# Patient Record
Sex: Female | Born: 1935 | Race: White | Hispanic: No | Marital: Married
Health system: Southern US, Community
[De-identification: ages and names within clinical notes are randomized; demographics above are authoritative.]

---

## 2003-05-04 ENCOUNTER — Ambulatory Visit (HOSPITAL_COMMUNITY): Admission: RE | Admit: 2003-05-04 | Discharge: 2003-05-04 | Payer: Self-pay | Admitting: Urology

## 2008-06-28 ENCOUNTER — Ambulatory Visit (HOSPITAL_COMMUNITY): Admission: RE | Admit: 2008-06-28 | Discharge: 2008-06-28 | Payer: Self-pay | Admitting: Pulmonary Disease

## 2008-08-03 ENCOUNTER — Encounter (HOSPITAL_COMMUNITY): Admission: RE | Admit: 2008-08-03 | Discharge: 2008-08-31 | Payer: Self-pay | Admitting: Pulmonary Disease

## 2008-08-04 ENCOUNTER — Ambulatory Visit (HOSPITAL_COMMUNITY): Payer: Self-pay | Admitting: Pulmonary Disease

## 2008-08-04 ENCOUNTER — Other Ambulatory Visit: Payer: Self-pay | Admitting: Pulmonary Disease

## 2008-09-07 ENCOUNTER — Ambulatory Visit: Payer: Self-pay | Admitting: Internal Medicine

## 2008-09-08 ENCOUNTER — Encounter: Payer: Self-pay | Admitting: Internal Medicine

## 2008-09-08 LAB — CONVERTED CEMR LAB
Eosinophils Relative: 2 % (ref 0–5)
Ferritin: 6 ng/mL — ABNORMAL LOW (ref 10–291)
HCT: 34 % — ABNORMAL LOW (ref 36.0–46.0)
Hemoglobin: 9.5 g/dL — ABNORMAL LOW (ref 12.0–15.0)
Iron: 14 ug/dL — ABNORMAL LOW (ref 42–145)
Lymphocytes Relative: 19 % (ref 12–46)
Lymphs Abs: 1 10*3/uL (ref 0.7–4.0)
Monocytes Absolute: 0.4 10*3/uL (ref 0.1–1.0)
Neutrophils Relative %: 71 % (ref 43–77)
Platelets: 268 10*3/uL (ref 150–400)
RDW: 25.7 % — ABNORMAL HIGH (ref 11.5–15.5)
UIBC: 412 ug/dL

## 2008-09-23 ENCOUNTER — Encounter: Payer: Self-pay | Admitting: Internal Medicine

## 2008-09-23 ENCOUNTER — Ambulatory Visit: Payer: Self-pay | Admitting: Internal Medicine

## 2008-09-23 ENCOUNTER — Ambulatory Visit (HOSPITAL_COMMUNITY): Admission: RE | Admit: 2008-09-23 | Discharge: 2008-09-23 | Payer: Self-pay | Admitting: Internal Medicine

## 2008-09-30 ENCOUNTER — Ambulatory Visit: Payer: Self-pay | Admitting: Internal Medicine

## 2008-10-14 ENCOUNTER — Ambulatory Visit: Payer: Self-pay | Admitting: Internal Medicine

## 2008-11-28 ENCOUNTER — Encounter (INDEPENDENT_AMBULATORY_CARE_PROVIDER_SITE_OTHER): Payer: Self-pay

## 2008-12-01 ENCOUNTER — Encounter: Payer: Self-pay | Admitting: Internal Medicine

## 2008-12-19 LAB — CONVERTED CEMR LAB
Eosinophils Absolute: 0.2 10*3/uL (ref 0.0–0.7)
Eosinophils Relative: 4 % (ref 0–5)
Hemoglobin: 10.6 g/dL — ABNORMAL LOW (ref 12.0–15.0)
Lymphs Abs: 1.4 10*3/uL (ref 0.7–4.0)
Monocytes Relative: 10 % (ref 3–12)
Neutrophils Relative %: 60 % (ref 43–77)
RBC: 5.17 M/uL — ABNORMAL HIGH (ref 3.87–5.11)
RDW: 18.4 % — ABNORMAL HIGH (ref 11.5–15.5)
WBC: 5.5 10*3/uL (ref 4.0–10.5)

## 2008-12-29 ENCOUNTER — Ambulatory Visit: Payer: Self-pay | Admitting: Internal Medicine

## 2008-12-30 ENCOUNTER — Encounter: Payer: Self-pay | Admitting: Internal Medicine

## 2009-01-16 ENCOUNTER — Encounter (INDEPENDENT_AMBULATORY_CARE_PROVIDER_SITE_OTHER): Payer: Self-pay

## 2009-02-02 ENCOUNTER — Encounter: Payer: Self-pay | Admitting: Internal Medicine

## 2009-02-07 DIAGNOSIS — R5381 Other malaise: Secondary | ICD-10-CM

## 2009-02-07 DIAGNOSIS — R609 Edema, unspecified: Secondary | ICD-10-CM

## 2009-02-07 DIAGNOSIS — R05 Cough: Secondary | ICD-10-CM

## 2009-02-07 DIAGNOSIS — R5383 Other fatigue: Secondary | ICD-10-CM

## 2009-02-07 DIAGNOSIS — J40 Bronchitis, not specified as acute or chronic: Secondary | ICD-10-CM | POA: Insufficient documentation

## 2009-02-07 DIAGNOSIS — R059 Cough, unspecified: Secondary | ICD-10-CM | POA: Insufficient documentation

## 2009-02-07 DIAGNOSIS — H409 Unspecified glaucoma: Secondary | ICD-10-CM | POA: Insufficient documentation

## 2009-02-07 DIAGNOSIS — J329 Chronic sinusitis, unspecified: Secondary | ICD-10-CM | POA: Insufficient documentation

## 2009-02-07 DIAGNOSIS — R0602 Shortness of breath: Secondary | ICD-10-CM

## 2009-02-09 ENCOUNTER — Ambulatory Visit: Payer: Self-pay | Admitting: Internal Medicine

## 2009-02-09 DIAGNOSIS — D508 Other iron deficiency anemias: Secondary | ICD-10-CM | POA: Insufficient documentation

## 2009-02-10 ENCOUNTER — Encounter: Payer: Self-pay | Admitting: Gastroenterology

## 2009-03-27 ENCOUNTER — Encounter (INDEPENDENT_AMBULATORY_CARE_PROVIDER_SITE_OTHER): Payer: Self-pay

## 2009-03-31 ENCOUNTER — Encounter: Payer: Self-pay | Admitting: Gastroenterology

## 2009-04-04 ENCOUNTER — Encounter: Payer: Self-pay | Admitting: Gastroenterology

## 2009-04-04 LAB — CONVERTED CEMR LAB
Basophils Relative: 1 % (ref 0–1)
Eosinophils Relative: 2 % (ref 0–5)
HCT: 40.1 % (ref 36.0–46.0)
Hemoglobin: 11.9 g/dL — ABNORMAL LOW (ref 12.0–15.0)
MCHC: 29.7 g/dL — ABNORMAL LOW (ref 30.0–36.0)
MCV: 77.1 fL — ABNORMAL LOW (ref 78.0–100.0)
Monocytes Absolute: 0.5 10*3/uL (ref 0.1–1.0)
Neutro Abs: 4 10*3/uL (ref 1.7–7.7)
RDW: 18.6 % — ABNORMAL HIGH (ref 11.5–15.5)

## 2009-06-20 ENCOUNTER — Encounter (INDEPENDENT_AMBULATORY_CARE_PROVIDER_SITE_OTHER): Payer: Self-pay

## 2009-06-30 LAB — CONVERTED CEMR LAB
Basophils Relative: 0 % (ref 0–1)
Eosinophils Absolute: 0.2 10*3/uL (ref 0.0–0.7)
Eosinophils Relative: 4 % (ref 0–5)
Lymphocytes Relative: 29 % (ref 12–46)
Lymphs Abs: 1.5 10*3/uL (ref 0.7–4.0)
Monocytes Absolute: 0.5 10*3/uL (ref 0.1–1.0)
Monocytes Relative: 10 % (ref 3–12)
Neutrophils Relative %: 57 % (ref 43–77)
RDW: 17.2 % — ABNORMAL HIGH (ref 11.5–15.5)
WBC: 5.1 10*3/uL (ref 4.0–10.5)

## 2010-04-17 IMAGING — CR DG CHEST 2V
2 series · 2 of 2 positions shown · non-contrast
Comparison: None

CLINICAL DATA: Shortness of breath

CHEST - 2 VIEW

[view not recorded (1 of 2)]
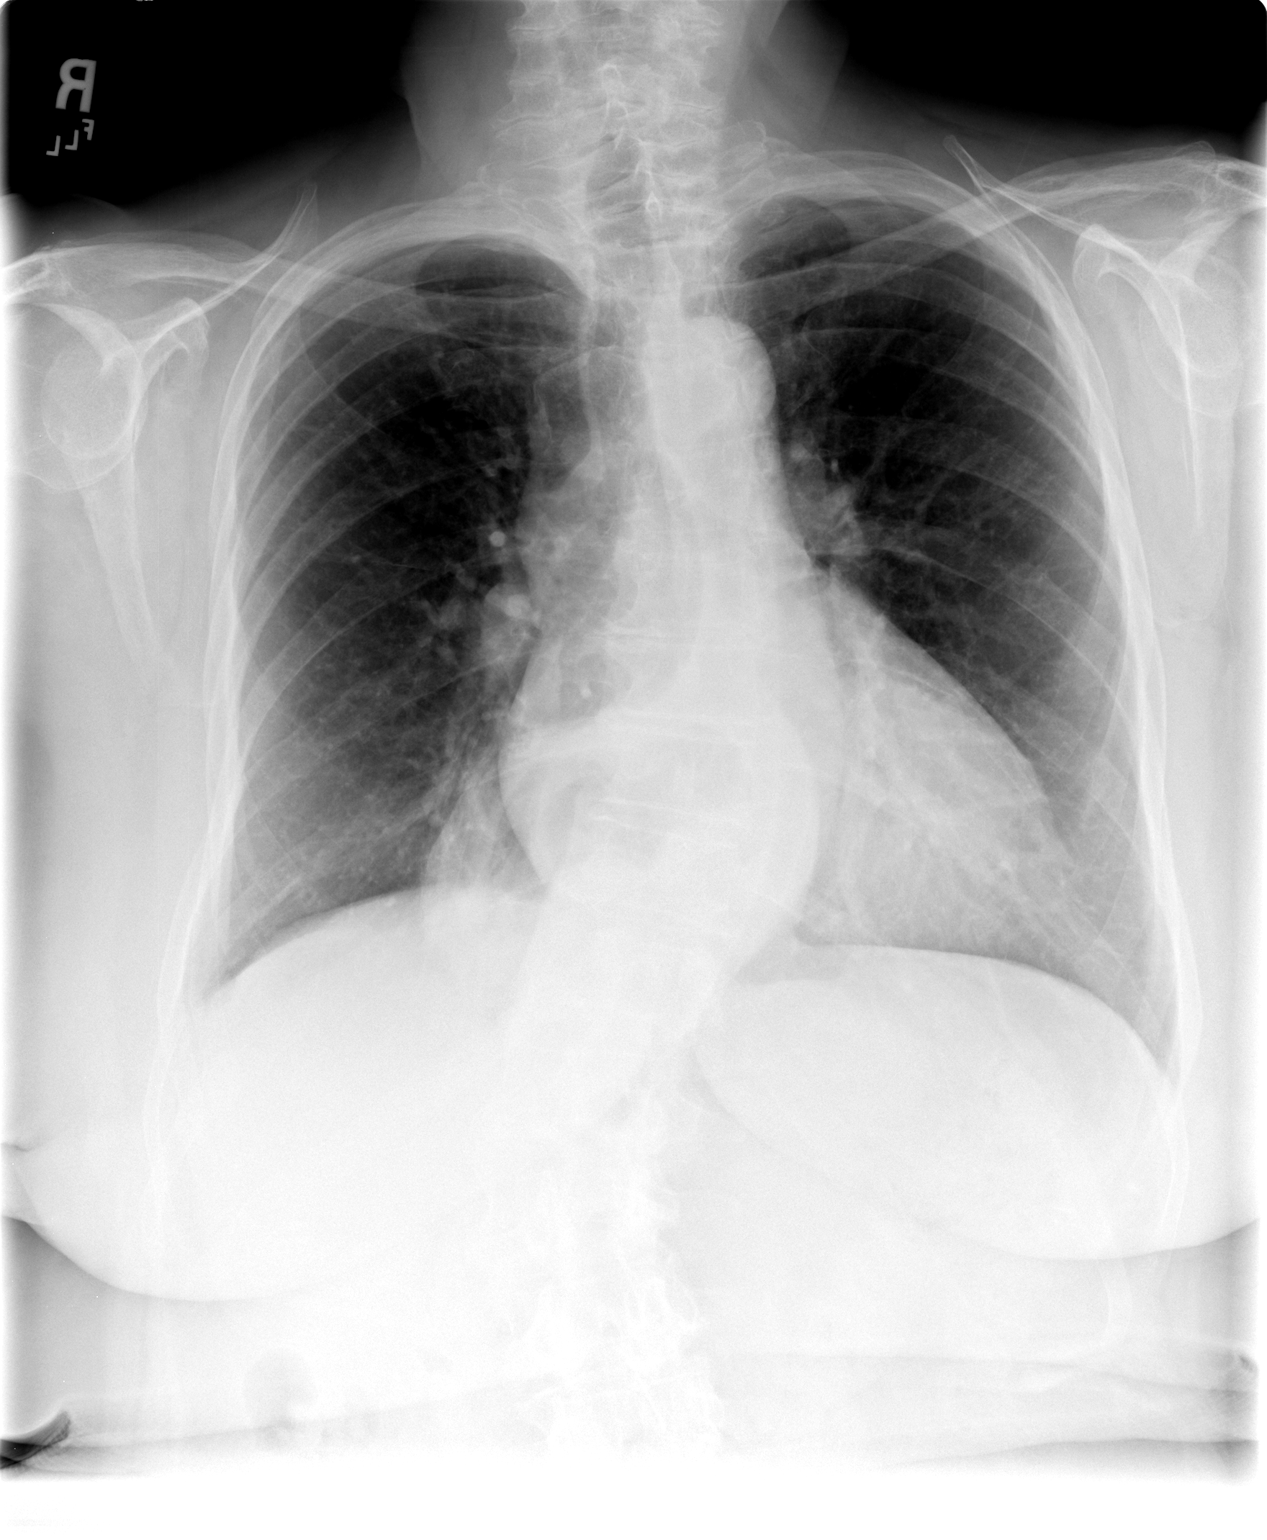

[view not recorded (2 of 2)]
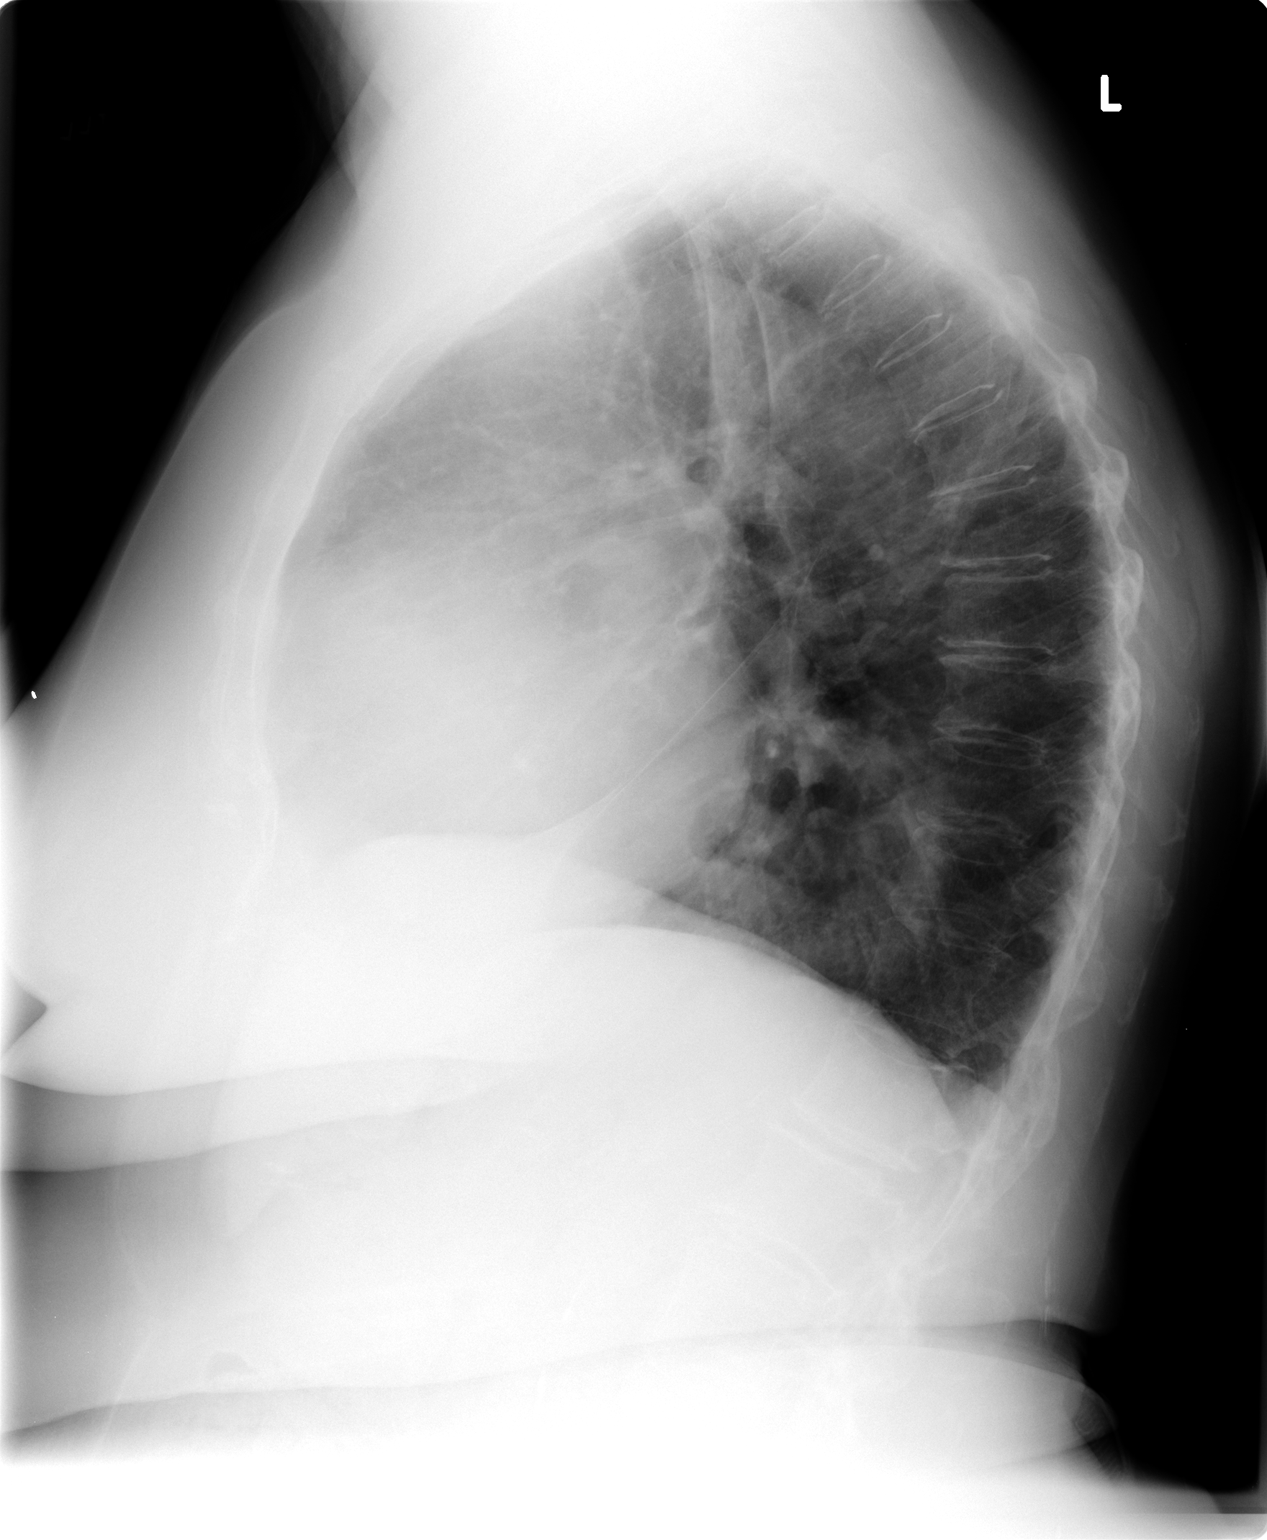

[2 of 2 positions shown; findings below may reference images not displayed]

FINDINGS: Mild cardiac enlargement.
Tortuous aorta.
Moderate sized hiatal hernia.
Mediastinal contours and pulmonary vascularity otherwise normal.
Mild bronchitic changes without infiltrate or effusion.
Biconvex thoracolumbar scoliosis and bony demineralization.
IMPRESSION: Cardiomegaly.
Hiatal hernia.
Mild bronchitic changes.

## 2011-01-15 NOTE — Op Note (Signed)
[name], Hancock                ACCOUNT NO.:  [id number]   MEDICAL RECORD NO.:  [id number]          PATIENT TYPE:  AMB   LOCATION:  DAY                           FACILITY:  [facility]   PHYSICIAN:  R. Roetta Sessions, M.D. DATE OF BIRTH:  [birth year]   DATE OF PROCEDURE:  09/23/2008  DATE OF DISCHARGE:                               OPERATIVE REPORT   PROCEDURE:  Ileocolonoscopy followed by esophagogastroduodenoscopy with  small bowel biopsy.   INDICATIONS FOR PROCEDURE:  A 77 year old lady sent over to me at the  courtesy of Dr. Kari Baars to further evaluate a profound microcytic  anemia, requiring transfusion recently.  She is essentially devoid of  any GI tract symptoms as Hemoccult negative at least 1 occasion through  our office on September 09, 2008.  She was found to have the following  laboratory abnormalities.  H&H 9.5 and 34.0, MCV 66.9, serum iron level  came back low at 14, and TIBC was 426.  Ferritin was 6 and iron  saturation 3%.  Colonoscopy is now being performed and the colonoscopy  is unrevealing.  As for the cause of bleeding, she will undergo an EGD.  Risks, benefits, alternatives, and limitations of this approach have  been reviewed, previously indicated in the bedside,  all questions were  answered and all parties agreeable.   It is notable that Carla Hancock has been taking Aleve recently for  arthritis.   PROCEDURE NOTE:  O2 saturation, blood pressure, pulse, and respirations  were monitored throughout the entirety of both procedures.   CONSCIOUS SEDATION:  Versed 75 mg IV and Demerol 6 mg IV in divided  doses.   INSTRUMENTATION:  Pentax video chip system.   FINDINGS:  Colonoscopy:  Digital rectal exam revealed no abnormalities.  Endoscopic findings:  The prep was good.  Colon:  Colonic mucosa was  surveyed from the rectosigmoid junction through the left transverse,  right colon, to the appendiceal orifice, ileocecal valve, and cecum.  These structures were well  seen and photographed for the record.  Terminal ileum was intubated to 5 cm.  From this level, the scope was  slowly and cautiously withdrawn.  All previously mentioned mucosal  surfaces were again seen.  She had a relatively haustral colon.  Mucosal  surfaces were well seen.  The patient had a left-sided diverticula  scattered; however, the remainder of the colonic mucosa appeared normal.  There was a single 2-mm erosion seen in the terminal ileum.  The scope  was pulled down to the rectum with thorough examination of the rectal  mucosa including the retroflexed view of the anal verge demonstrated no  abnormalities.  The patient tolerated this procedure well and was  prepared for EGD.   FINDINGS:  Examination of the tubular esophagus revealed a noncritical  Schatzki ring; otherwise, esophageal mucosa appeared unremarkable.  EGD  junction easily traversed.  Stomach:  Gastric cavity was emptied and  insufflated well with air.  Thorough examination of the gastric mucosa  including the retroflexed view of the proximal stomach and  esophagogastric junction demonstrated a rather large hiatal hernia  and a  2-cm submucosal lesion, most consistent with a lipoma along the lesser  curvature.  Please see photos.  Otherwise, gastric mucosa appeared  entirely normal.  The pylorus was patent and easily traversed.  Examination of the bulb, second, and third portion revealed no  abnormalities.  Therapeutic/diagnostic maneuvers were performed.  Biopsies of D2 and D3 were taken for histologic study.  The patient  tolerated the procedure well and was reacted to Endoscopy.   IMPRESSION:  Colonoscopy:  Normal rectum, left-sided diverticula,  colonic mucosa appeared normal, single terminal ileal erosion.  Esophagogastroduodenoscopy findings, noncritical Schatzki ring not  manipulated; otherwise, normal esophagus rather large hiatal hernia,  submucosal lesion consistent with lipoma lesser curvature;  otherwise,  unremarkable gastric mucosa, patent pylorus, normal D1-D3 status post  biopsy of D3 and D3.   I suspect Carla Hancock either has malabsorption of iron going on or she has  a nonsteroidal antiinflammatory drug enteropathy and has been using  blood from small bowel insult related to Aleve.  I doubt celiac disease  but these biopsies will explore that possibility.   RECOMMENDATIONS:  1. Follow up on path.  2. I would like Carla Hancock to return 3 Hemoccults, not prior to next      week.  We will line this up through the office.  Follow up on path.      If her path is unrevealing and any of her Hemoccults are positive,      we would proceed with a video capsule study of her small bowel.  If      path is negative and Hemoccults were negative, we would simply ask      her to entirely refrain from taking any nonsteroidals and see if      her H&H would rebound without further intervention.      Carla Hancock, M.D.  Electronically Signed     RMR/MEDQ  D:  09/23/2008  T:  09/23/2008  Job:  [id number]   cc:   Ramon Dredge L. Juanetta Gosling, M.D.  Fax: [phone]   J. Darreld Mclean, M.D.  Fax: [phone]

## 2011-01-15 NOTE — H&P (Signed)
NAME:  Carla Hancock, Carla Hancock                ACCOUNT NO.:  [id number]   MEDICAL RECORD NO.:  [id number]          PATIENT TYPE:  AMB   LOCATION:  DAY                           FACILITY:  [facility]   PHYSICIAN:  R. Roetta Sessions, M.D. DATE OF BIRTH:  [birth year]   DATE OF ADMISSION:  DATE OF DISCHARGE:  LH                              HISTORY & PHYSICAL   REFERRING PHYSICIAN:  Edward L. Juanetta Gosling, MD   REASON FOR CONSULTATION:  Marked microcytic anemia.   Ms. Carla Hancock is a very pleasant 77 year old Caucasian female who was  sent over courtesy of Dr. Shaune Pollack to further evaluate her profound  microcytic anemia.  Apparently, she was visiting her daughter at  [place] and developed progressive dyspnea and fatigue, came back and  saw Dr. Juanetta Gosling and lab work revealed a hemoglobin in the 6 range.  She  received 2 units transfusion with packed red blood cells.  Her  hemoglobin came up in the 9.3 range, MCV markedly depressed in the 60  range.  By report, she is hemoccult negative, in fact Carla Hancock has not  had any gastrointestinal symptoms.  She denies rectal bleeding, melena,  constipation, or diarrhea.  She has not had any abdominal pain.  No  odynophagia, dysphagia, early satiety, reflux symptoms, nausea,  vomiting, or weight loss.  Appetite has been well maintained.  She has  never had her lower GI tract evaluated.  She tells me that many, many  years ago, she had a barium study which demonstrated a hiatal hernia.  She did have a recent chest x-ray, which demonstrated cardiomegaly and a  hiatal hernia as well.  She has never had her lower GI tract evaluated  in anyway.  There is no family history of GI malignancy, anemia, or  celiac disease.  It is notable that for a couple of months preceding her  being found to be anemic, she was taking a couple of Aleve daily for hip  pain under direction of Dr. Hilda Lias.   PAST MEDICAL HISTORY:  Significant for glaucoma and recent anemia.   PAST SURGERIES:   Complete hysterectomy, right foot surgery, left eye  surgery.  She was recently cast splinted for an avulsion fracture, right  ankle.   CURRENT MEDICATIONS:  Dorzolamide eye drops, Alphagan eye drops, Lumigan  eye drops, potassium 20 mEq daily.   ALLERGIES:  VIOXX causes hypertension, CELEBREX causes a rash.   FAMILY HISTORY:  Father died age 78 with MI.  Mother died age 67 with  some heart problems.  No history of chronic GI or liver illness.   SOCIAL HISTORY:  The patient is married.  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  No tobacco, no alcohol, no  illicit drugs.   REVIEW OF SYSTEMS:  As in the history of present illness.  Recent  progressive dyspnea until she was transfused.  No night sweats.  No  fever or chills.   PHYSICAL EXAMINATION:  GENERAL:  A  pleasant 77 year old lady resting  calmly.  VITAL SIGNS:  Weight 165, height 5 feet 3, temp 97.8, BP 180/82, pulse  84.  SKIN:  Warm and dry.  HEENT:  She does have a little pallor and there is no scleral icterus.  Conjunctivae are somewhat pale as well.  CHEST:  Lungs are clear to auscultation.  CARDIAC:  Regular rate and rhythm without murmur, gallop, or rub.  BREAST:  Deferred.  ABDOMEN:  Nondistended.  Positive bowel sounds, soft, entirely nontender  without appreciable mass or organomegaly.  EXTREMITIES:  No edema.  RECTAL:  Deferred, has colonoscopy nearing.   IMPRESSION:  Carla Hancock is a pleasant 77 year old lady who has  presented recently with a significant symptomatic microcytic anemia.  She is hemoccult negative, seen in Dr. Juanetta Gosling' office, and clinically  is essentially devoid of any GI tract symptoms by the fact she is 77 and  has never had a colonoscopy.   Nonsteroidal use preceded the onset of her symptoms leading to the  diagnosis of anemia.  In this setting, we certainly need to agree with  Dr.  Juanetta Gosling, we need to be concerned about intermittent blood loss from  her gastrointestinal tract anywhere along the line for that matter, iron  malabsorption, as well as bone marrow failure for that matter I suppose  will remain in the differential at this time.   RECOMMENDATIONS:  I told Carla Hancock she ought to go ahead and have a  diagnostic colonoscopy in the very near future.  Risks, benefits,  alternatives, and limitations have been reviewed.  I told her that if  her colonoscopy looked fine, then we would go ahead and just turn her  around and perform a diagnostic EGD at the same time.  This approach  also has been reviewed and she is agreeable.  Questions have been  answered.  I do not see where she has had iron studies drawn.  We will  go ahead and do iron studies today and we will repeat her CBC and see  where we stand.  I will make further recommendations in the very near  future.   I would like to thank Dr. Shaune Pollack for allowing me to see this nice  lady today in consultation.      Jonathon Bellows, M.D.  Electronically Signed     RMR/MEDQ  D:  09/07/2008  T:  09/08/2008  Job:  [id number]

## 2011-01-18 NOTE — Op Note (Signed)
Carla Hancock, Carla Hancock                          ACCOUNT NO.:  [id number]   MEDICAL RECORD NO.:  [id number]                   PATIENT TYPE:  AMB   LOCATION:  DAY                                  FACILITY:  [facility]   PHYSICIAN:  Cody M. Ulice Brilliant, D.P.M.               DATE OF BIRTH:  02-24-1931   DATE OF PROCEDURE:  DATE OF DISCHARGE:                                 OPERATIVE REPORT   PREOPERATIVE DIAGNOSES:  Hallux abductovalgus deformity right foot.   POSTOPERATIVE DIAGNOSES:  1. Hallux abductovalgus deformity right foot.  2. Hammer toe deformity second digit right foot.   PROCEDURE:  1. Keller bunionectomy first metatarsal phalangeal joint right foot with     0.062 K-wire fixation.  2. Arthroplasty second digit right foot with 0.045 K-wire fixation.   SURGEON:  Denny Peon. Ulice Brilliant, DPM   ANESTHESIA:  MAC   INDICATIONS FOR SURGERY:  This 77 year old white female with severe hallux  valgus deformity of right foot.  Great toe is noted to be abutting second  toe.  Second toe has some deviation to it, chronically painful.  All shoes  are uncomfortable.   DESCRIPTION OF PROCEDURE:  Carla Hancock is brought into the OR and placed on the  table in the supine position.  IV sedation is established.  A Mayo block is  performed about the first MTP of this right foot. A pneumatic ankle  tourniquet is then applied across her right ankle.  Her foot is prepped and  draped in the usual aseptic fashion.  An ACE bandage is utilized to  exsanguinate her foot.  The tourniquet is inflated to 250 mmHg.   Procedure #1: KELLER BUNIONECTOMY RIGHT FOOT.  Attention is directed to the  first MTP of the right foot.  A 7 cm curvilinear skin incision is made over  the dorsal central aspect of the first MTP extending to the midshaft of the  proximal phalanx and back to the midshaft of the first metatarsal.  The  incision is deepened via sharp and blunt and sharp dissection through  subcutaneous tissues.  Care is taken to  try to avoid neural and vascular  structures with any crossing vessels that cannot be retracted, clamped, and  cauterized.   With this performed, a dorsal linear capsulotomy is made from the midshaft  of the proximal phalanx to the distal two-thirds of the first metatarsal.  The capsular and periosteal tissues are reflected away from the medial  eminence of the first metatarsal head and about both the medial and lateral  aspects of the base of the proximal phalanx.  The tissue here is noted to be  relatively degenerative with gouty tophaceous deposits noted within.  The  capsular tissues are reflected away from the medial eminence.  The medial  eminence is then excised with #62 blade on the oscillating saw. What is very  noteworthy is that the first metatarsal  head is extremely soft.  Minimal  effort is needed to remove the medial eminence.   Attention is then directed to the base of the proximal phalanx.  The base of  the phalanx is then removed with the 62 blade with the oscillating saw and  the base of the proximal phalanx is then excised completely following  osteotomy with the 15 blade with sharp dissection.  The flexor tendon is  noted still to be intact. What is evident also, at this point is that the  first metatarsal is not moving in a more lateral or closing position. I felt  that following removal of the base of the proximal phalanx the IM angle  would likely close down, but it seems as though the first metatarsal  cuneiform articulation is not reducible.  A 0.062 K-wire is the introduced  and driven from the base of the proximal phalanx through the distal aspect  of the toe.  Then with the great toe held in a rectus position the K-wire is  then driven back across the first MTP.  An approximate 1.5-2.0 cm gap is  noted between the base of the proximal phalanx and the first metatarsal  head.  At this point the capsular tissues are pursestrung around this void  between the 2  bones with 2-0 Vicryl in a pursestringing type of suture.  The  created soft tissue apposition is noted to be adequate at this point.  The  remainder of the deep fascia is then closed over the base of the proximal  phalanx and the first metatarsal head with 3-0 Vicryl and running horizontal  mattress suture.  The wound is then flushed.  The subcutaneous tissues are  then reapproximated and closed with 4-0 Vicryl in a running horizontal  mattress suture and the skin is closed with 4-0 Vicryl and a subcuticular  suture.   It is noteworthy that during the procedure we examined the foot several  times with the XiScan.  At this point the forefoot is loaded; and it was my  hope that correcting the HAV would create a more rectus appearance to the  second toe, but unfortunately this is noted to be the case.  The second  digit is deviated within the intermediate phalanx and is adducted towards  the great toe.  At this point, the second digit is anesthetized with 0.5%  Marcaine.   Procedure #3: ARTHROPLASTY SECOND DIGIT RIGHT FOOT.  Attention is directed  to the second toe.  An incision is made from the proximal aspect of the  intermediate phalanx crossing over the proximal interphalangeal joint.  The  incision is made with a #15 blade and deepened with a fresh #15 blade  through subcutaneous tissues.  The proximal interphalangeal joint is entered  by a dorsal transverse incision across the extensor tendon.  The medial and  lateral collateral ligaments are isolated and severed.  The head of the  proximal phalanx is then removed with a single action bone forceps. A 0.045  K-wire was then introduced and driven from the base of the intermediate  phalanx through the distal aspect of the toe and then retrograded across the  proximal phalanx.  Then, with the toe held rectus, in all 3 body planes, driven across the metatarsal phalangeal joint.  With the forefoot loaded,  both the great and second toe are  noted to be relatively rectus.  The wound  is flushed.  Redundant soft tissue around the joint is excised.  The  skin is  then closed with 4-0 Prolene in simple interrupted and horizontal mattress  sutures.  Both K-wires were then bent with the aid of a Kocher hemostat and  then cut and pin caps are affixed to the ends of the K-wires.  The incisions  were then injected postoperatively with Marcaine and Hexadrol.  A Betadine  soaked Adaptic dressing is applied across each incision.  A dry sterile  compressive dressing then follows.  Coban dressing is applied across this  and the end of the toes.  The tourniquet is then deflated.   Carla Hancock tolerates the anesthesia and procedure well.  Immediately  postoperative I informed her that we needed to go ahead and correct her  second toe. She related that she was happy that we went ahead and did it  instead of having to do it at a later date.  A list of written instructions  will be presented to her.  These were orally explained to her by myself.  A  prescription for Lorcet Plus, Phenergan 25 mg is dispensed.  She is to take  the Lorcet Plus q.4-6h. as needed for pain.  The Phenergan every 6 hours, if  needed, for nausea.  I have also written her a prescription for a walker in  case she needs this.  A Cam walker is dispensed along with a surgical shoe.  She will be seen within 7 days for first postoperative visit.                                               Denny Peon. Ulice Brilliant, D.P.M.    CMD/MEDQ  D:  05/04/2003  T:  05/04/2003  Job:  [id number]

## 2011-01-18 NOTE — H&P (Signed)
NAMEASHEA, WINIARSKI                            ACCOUNT NO.:  [id number]   MEDICAL RECORD NO.:  [id number]                  PATIENT TYPE:   LOCATION:                                       FACILITY:   PHYSICIAN:  Denny Peon. Ulice Brilliant, D.P.M.               DATE OF BIRTH:   DATE OF ADMISSION:  05/04/2003  DATE OF DISCHARGE:                                HISTORY & PHYSICAL   PREOPERATIVE H&P  For surgery tomorrow, May 04, 2003, at [facility].   HISTORY OF PRESENT ILLNESS:  Ms. Centner is a 77 year old white female who has  a painful bunion deformity of her right foot.  She relates that this has  been bothering her for several years, but has gotten to the point now where  it is uncomfortable in all of her shoes.  Ms. Kittleson has done the appropriate  thing such as wearing a wider shoe, trying to find a shoe without any seams  running across the toe-box area, all of which have helped some, but it is to  the point now where, basically, all her shoes are uncomfortable.   PAST MEDICAL HISTORY:  Significant for edema and a history of osteoporosis.  She is on furosemide at this point as well as Fosamax.   PAST SURGICAL HISTORY:  Includes previous left bunion surgery years ago,  previous hysterectomy, preceded by childbirth x2 and she has had previous  laser surgery on both eyes.   ALLERGIES:  She has no known drug allergies.   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  She lists Dr. Kari Baars, as her primary care physician.  She relates that she neither smokes  nor drinks alcohol.   PHYSICAL EXAMINATION:  VITAL SIGNS:  She is a 5 foot 2 inch, 172-pound,  white female noted to wear a size 6 shoe.  EXTREMITIES: She is noted to have a well-defined HAV deformity with a  prominent medial eminence of the first MTP of the right foot.  Range of  motion is somewhat uncomfortable.  The great toe significantly abuts and is  starting to dorsally deviate the second digit.   Radiographs are taken and she has a very large intermetatarsal angle between  the first and second.  A large hallux valgus angle and the bone stock of the  foot is noted to be moderately affected.   ASSESSMENT:  A fairly well, defined, large hallux valgus in this right foot.   PLAN:  I have discussed options.  She is a 77 year old white female with  osteoporosis.  I think that probably the most viable option is a Museum/gallery conservator.  I have discussed with her the [facility] bunionectomy, i.e. a  resection of the medial eminence as well as removal of the proximal 15-30%  of the proximal phalanx.  I think, given her set of circumstances, this  probably makes the most  sense.  We have discussed other procedures which  require an osteotomy with fixation which, I feel, probably would be putting  her at significant risk for collapse of the osteotomy  with resulting delayed or nonunion.  We have had this discussion.  She has  read her consent form, apparently understood this, and signed.  We have  scheduled this for [facility] May 04, 2003 under MAC  anesthesia.                                               Denny Peon. Ulice Brilliant, D.P.M.    CMD/MEDQ  D:  05/03/2003  T:  05/03/2003  Job:  [id number]

## 2011-06-07 LAB — CROSSMATCH: Antibody Screen: NEGATIVE

## 2011-06-07 LAB — HEMOGLOBIN AND HEMATOCRIT, BLOOD
HCT: 29 % — ABNORMAL LOW (ref 36.0–46.0)
Hemoglobin: 9.1 g/dL — ABNORMAL LOW (ref 12.0–15.0)

## 2011-06-07 LAB — ABO/RH: ABO/RH(D): O POS

## 2013-10-31 DEATH — deceased

## 2013-11-29 ENCOUNTER — Telehealth: Payer: Self-pay

## 2013-11-29 NOTE — Telephone Encounter (Signed)
Patient past away @ [address] [facility] [place] per [place] [employer]
